# Patient Record
Sex: Female | Born: 1975 | Hispanic: No | Marital: Single | Smoking: Current every day smoker
Health system: Southern US, Community
[De-identification: ages and names within clinical notes are randomized; demographics above are authoritative.]

## PROBLEM LIST (undated history)

## (undated) DIAGNOSIS — D649 Anemia, unspecified: Secondary | ICD-10-CM

## (undated) DIAGNOSIS — B20 Human immunodeficiency virus [HIV] disease: Secondary | ICD-10-CM

## (undated) DIAGNOSIS — G629 Polyneuropathy, unspecified: Secondary | ICD-10-CM

## (undated) DIAGNOSIS — J45909 Unspecified asthma, uncomplicated: Secondary | ICD-10-CM

---

## 2014-04-01 ENCOUNTER — Encounter (HOSPITAL_COMMUNITY): Payer: Self-pay | Admitting: Emergency Medicine

## 2014-04-01 ENCOUNTER — Emergency Department (HOSPITAL_COMMUNITY): Payer: Self-pay

## 2014-04-01 DIAGNOSIS — J159 Unspecified bacterial pneumonia: Secondary | ICD-10-CM | POA: Insufficient documentation

## 2014-04-01 DIAGNOSIS — Z8669 Personal history of other diseases of the nervous system and sense organs: Secondary | ICD-10-CM | POA: Insufficient documentation

## 2014-04-01 DIAGNOSIS — Z862 Personal history of diseases of the blood and blood-forming organs and certain disorders involving the immune mechanism: Secondary | ICD-10-CM | POA: Insufficient documentation

## 2014-04-01 DIAGNOSIS — F17200 Nicotine dependence, unspecified, uncomplicated: Secondary | ICD-10-CM | POA: Insufficient documentation

## 2014-04-01 DIAGNOSIS — Z21 Asymptomatic human immunodeficiency virus [HIV] infection status: Secondary | ICD-10-CM | POA: Insufficient documentation

## 2014-04-01 DIAGNOSIS — J45901 Unspecified asthma with (acute) exacerbation: Secondary | ICD-10-CM | POA: Insufficient documentation

## 2014-04-01 NOTE — ED Notes (Addendum)
Pt c/o sob and left sided chest pain after eating dinner. Chest pain is when she takes a deep breath.

## 2014-04-02 ENCOUNTER — Emergency Department (HOSPITAL_COMMUNITY)
Admission: EM | Admit: 2014-04-02 | Discharge: 2014-04-02 | Disposition: A | Discharge status: Home / Self Care | Payer: Self-pay | Attending: Emergency Medicine | Admitting: Emergency Medicine

## 2014-04-02 DIAGNOSIS — J189 Pneumonia, unspecified organism: Secondary | ICD-10-CM

## 2014-04-02 HISTORY — DX: Human immunodeficiency virus (HIV) disease: B20

## 2014-04-02 HISTORY — DX: Polyneuropathy, unspecified: G62.9

## 2014-04-02 HISTORY — DX: Unspecified asthma, uncomplicated: J45.909

## 2014-04-02 HISTORY — DX: Anemia, unspecified: D64.9

## 2014-04-02 MED ORDER — SODIUM CHLORIDE 0.9 % IV BOLUS (SEPSIS)
1000.0000 mL | Freq: Once | INTRAVENOUS | Status: AC
  Administered 2014-04-02: 1000 mL via INTRAVENOUS

## 2014-04-02 MED ORDER — AZITHROMYCIN 250 MG PO TABS: 250.0000 mg | ORAL_TABLET | Freq: Every day | ORAL | Status: AC

## 2014-04-02 MED ORDER — NAPROXEN 500 MG PO TABS: 500.0000 mg | ORAL_TABLET | Freq: Two times a day (BID) | ORAL | Status: AC

## 2014-04-02 MED ORDER — AZITHROMYCIN 250 MG PO TABS
500.0000 mg | ORAL_TABLET | Freq: Once | ORAL | Status: AC
  Administered 2014-04-02: 500 mg via ORAL
  Filled 2014-04-02: qty 2

## 2014-04-02 MED ORDER — DEXTROSE 5 % IV SOLN
1.0000 g | Freq: Once | INTRAVENOUS | Status: AC
  Administered 2014-04-02: 1 g via INTRAVENOUS
  Filled 2014-04-02: qty 10

## 2014-04-02 MED ORDER — KETOROLAC TROMETHAMINE 30 MG/ML IJ SOLN
30.0000 mg | Freq: Once | INTRAMUSCULAR | Status: AC
  Administered 2014-04-02: 30 mg via INTRAVENOUS
  Filled 2014-04-02: qty 1

## 2014-04-02 MED ORDER — BENZONATATE 100 MG PO CAPS: 100.0000 mg | ORAL_CAPSULE | Freq: Three times a day (TID) | ORAL | Status: AC

## 2014-04-02 NOTE — Discharge Instructions (Signed)
Please call your doctor for a followup appointment within 24-48 hours. When you talk to your doctor please let them know that you were seen in the emergency department and have them acquire all of your records so that they can discuss the findings with you and formulate a treatment plan to fully care for your new and ongoing problems. ° °[facility] ° ° ° °Edward Hawkins MD. Specialty: Pulmonary Disease Contact information: [address]  °[address]  °[place] [address]  °[phone]  ° °Margaret Simpson, MD. Specialty: Family Medicine Contact information: [address]  °[place] [address]  °[phone]  ° °Scott Luking, MD. Specialty: Family Medicine Contact information: [address]  °[address]  °[place] [address]  °[phone]  ° °Tesfaye Fanta, MD Specialty: Internal Medicine Contact information: [address]  °[place] [address]  °[phone]  ° °Zach Hall, MD. Specialty: Internal Medicine Contact information: [address]  °[place] [address]  °[phone]  ° °Angus Mcinnis, MD. Specialty: Family Medicine Contact information: [address]  °[place] [address]  °[phone]  ° °Stephen Knowlton, MD. Specialty: Family Medicine Contact information: [address]  °[address]  °[place] [address]  °[phone]  ° °Roy Fagan, MD. Specialty: Internal Medicine Contact information: [address]  °[address]  °[place]  [address]  °[phone]  ° ° °

## 2014-04-02 NOTE — ED Provider Notes (Signed)
CSN: [id number]     Arrival date & time 04/01/14  2220 History   First MD Initiated Contact with Patient 04/02/14 0118     Chief Complaint  Patient presents with  . Shortness of Breath     (Consider location/radiation/quality/duration/timing/severity/associated sxs/prior Treatment) HPI Comments: 38 year old female with a history of HIV, asthma who has been complaining of shortness of breath and chest pain for 2 days. She has an associated productive cough with phlegm. Currently her pain is over her left side, sharp and stabbing, worse with deep breathing and not associated with fevers chills or vomiting. She does note having some watery diarrhea for the last couple of days.  Patient is a 38 y.o. female presenting with shortness of breath. The history is provided by the patient.  Shortness of Breath   Past Medical History  Diagnosis Date  . HIV (human immunodeficiency virus infection)   . Asthma   . Anemia   . Neuropathy    History reviewed. No pertinent past surgical history. History reviewed. No pertinent family history. History  Substance Use Topics  . Smoking status: Current Every Day Smoker  . Smokeless tobacco: Not on file  . Alcohol Use: No   OB History   Grav Para Term Preterm Abortions TAB SAB Ect Mult Living                 Review of Systems  Respiratory: Positive for shortness of breath.   All other systems reviewed and are negative.     Allergies  Ivp dye  Home Medications   Prior to Admission medications   Not on File   BP 105/73  Pulse 93  Temp(Src) 99 F (37.2 C) (Oral)  Resp 16  Ht 5' (1.524 m)  Wt 93 lb (42.185 kg)  BMI 18.16 kg/m2  SpO2 98%  LMP 03/24/2014 Physical Exam  Nursing note and vitals reviewed. Constitutional: She appears well-developed and well-nourished. No distress.  HENT:  Head: Normocephalic and atraumatic.  Mouth/Throat: Oropharynx is clear and moist. No oropharyngeal exudate.  Eyes: Conjunctivae and EOM are normal.  Pupils are equal, round, and reactive to light. Right eye exhibits no discharge. Left eye exhibits no discharge. No scleral icterus.  Neck: Normal range of motion. Neck supple. No JVD present. No thyromegaly present.  Cardiovascular: Normal rate, regular rhythm, normal heart sounds and intact distal pulses.  Exam reveals no gallop and no friction rub.   No murmur heard. Pulmonary/Chest: Effort normal and breath sounds normal. No respiratory distress. She has no wheezes. She has no rales.  Abdominal: Soft. Bowel sounds are normal. She exhibits no distension and no mass. There is no tenderness.  Musculoskeletal: Normal range of motion. She exhibits no edema and no tenderness.  Lymphadenopathy:    She has no cervical adenopathy.  Neurological: She is alert. Coordination normal.  Skin: Skin is warm and dry. No rash noted. No erythema.  Psychiatric: She has a normal mood and affect. Her behavior is normal.    ED Course  Procedures (including critical care time) Labs Review Labs Reviewed - No data to display  Imaging Review Dg Chest 2 View  04/01/2014   CLINICAL DATA:  Chest pain, cough and fever  EXAM: CHEST  2 VIEW  COMPARISON:  None.  FINDINGS: Heart size is normal. Mediastinal shadows are normal. There is lingular pneumonia. No lobar collapse. No effusions. No bony finding. The lungs in general are slightly hyperinflated.  IMPRESSION: Lingular pneumonia.   Electronically Signed   By:  Paulina FusiMark  Shogry M.D.   On: 04/01/2014 23:10      MDM   Final diagnoses:  None    The patient has concerning symptoms for pneumonia or possible pneumothorax but on exam has no focal rales or respiratory distress. Oxygenation is 97% on room air, no fever, normal pulse and mild hypotension. The patient does have HIV, she is very very small and I suspect that mild hypotension is probably her baseline. At this time chest x-ray was obtained and a 2 view PA and lateral view of the chest shows a lingular pneumonia  consistent with the patient's left-sided sharp chest pain. She will be given antibiotics, IV fluids but does not appear toxic and will likely be discharged home.  Medications given as below, patient appears stable for discharge  Meds given in ED:  Medications  sodium chloride 0.9 % bolus 1,000 mL (1,000 mLs Intravenous New Bag/Given 04/02/14 0155)  cefTRIAXone (ROCEPHIN) 1 g in dextrose 5 % 50 mL IVPB (1 g Intravenous New Bag/Given 04/02/14 0156)  azithromycin (ZITHROMAX) tablet 500 mg (500 mg Oral Given 04/02/14 0153)  ketorolac (TORADOL) 30 MG/ML injection 30 mg (30 mg Intravenous Given 04/02/14 0154)    New Prescriptions   AZITHROMYCIN (ZITHROMAX Z-PAK) 250 MG TABLET    Take 1 tablet (250 mg total) by mouth daily. 500mg  PO day 1, then 250mg  PO days 205   BENZONATATE (TESSALON) 100 MG CAPSULE    Take 1 capsule (100 mg total) by mouth every 8 (eight) hours.   NAPROXEN (NAPROSYN) 500 MG TABLET    Take 1 tablet (500 mg total) by mouth 2 (two) times daily with a meal.      Vida RollerBrian D Mildred Tuccillo, MD 04/02/14 [phone]

## 2014-04-02 NOTE — ED Notes (Signed)
Pt alert & oriented x4, stable gait. Patient given discharge instructions, paperwork & prescription(s). Patient  instructed to stop at the registration desk to finish any additional paperwork. Patient verbalized understanding. Pt left department w/ no further questions. 

## 2014-05-25 ENCOUNTER — Telehealth: Payer: Self-pay

## 2014-05-25 NOTE — Telephone Encounter (Signed)
Message left on voicemail to call the office to schedule intake appointment.   Referral from [facility].   Laurell Josephs, RN

## 2014-06-16 ENCOUNTER — Ambulatory Visit: Payer: Self-pay

## 2015-03-17 IMAGING — CR DG CHEST 2V
2 series · 2 of 2 positions shown · non-contrast
Comparison: None.

CLINICAL DATA: Chest pain, cough and fever

EXAM:
CHEST  2 VIEW

[view not recorded (1 of 2)]
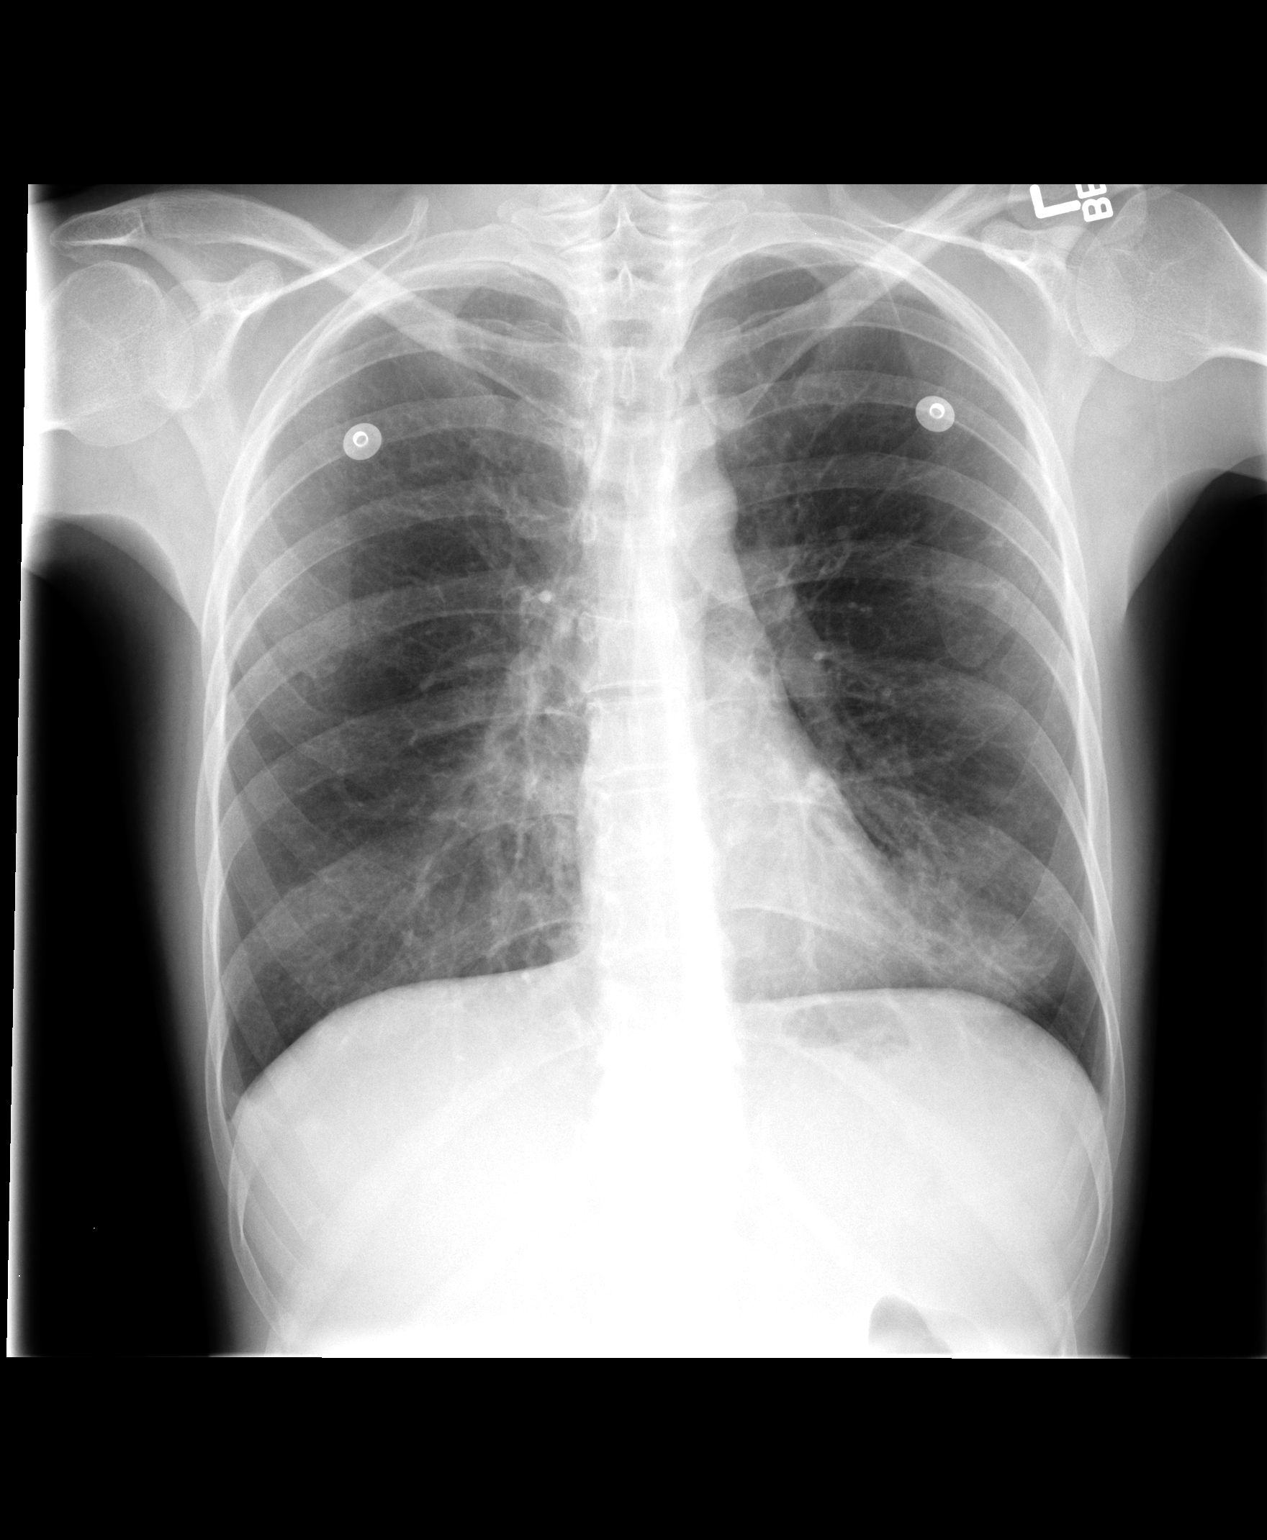

[view not recorded (2 of 2)]
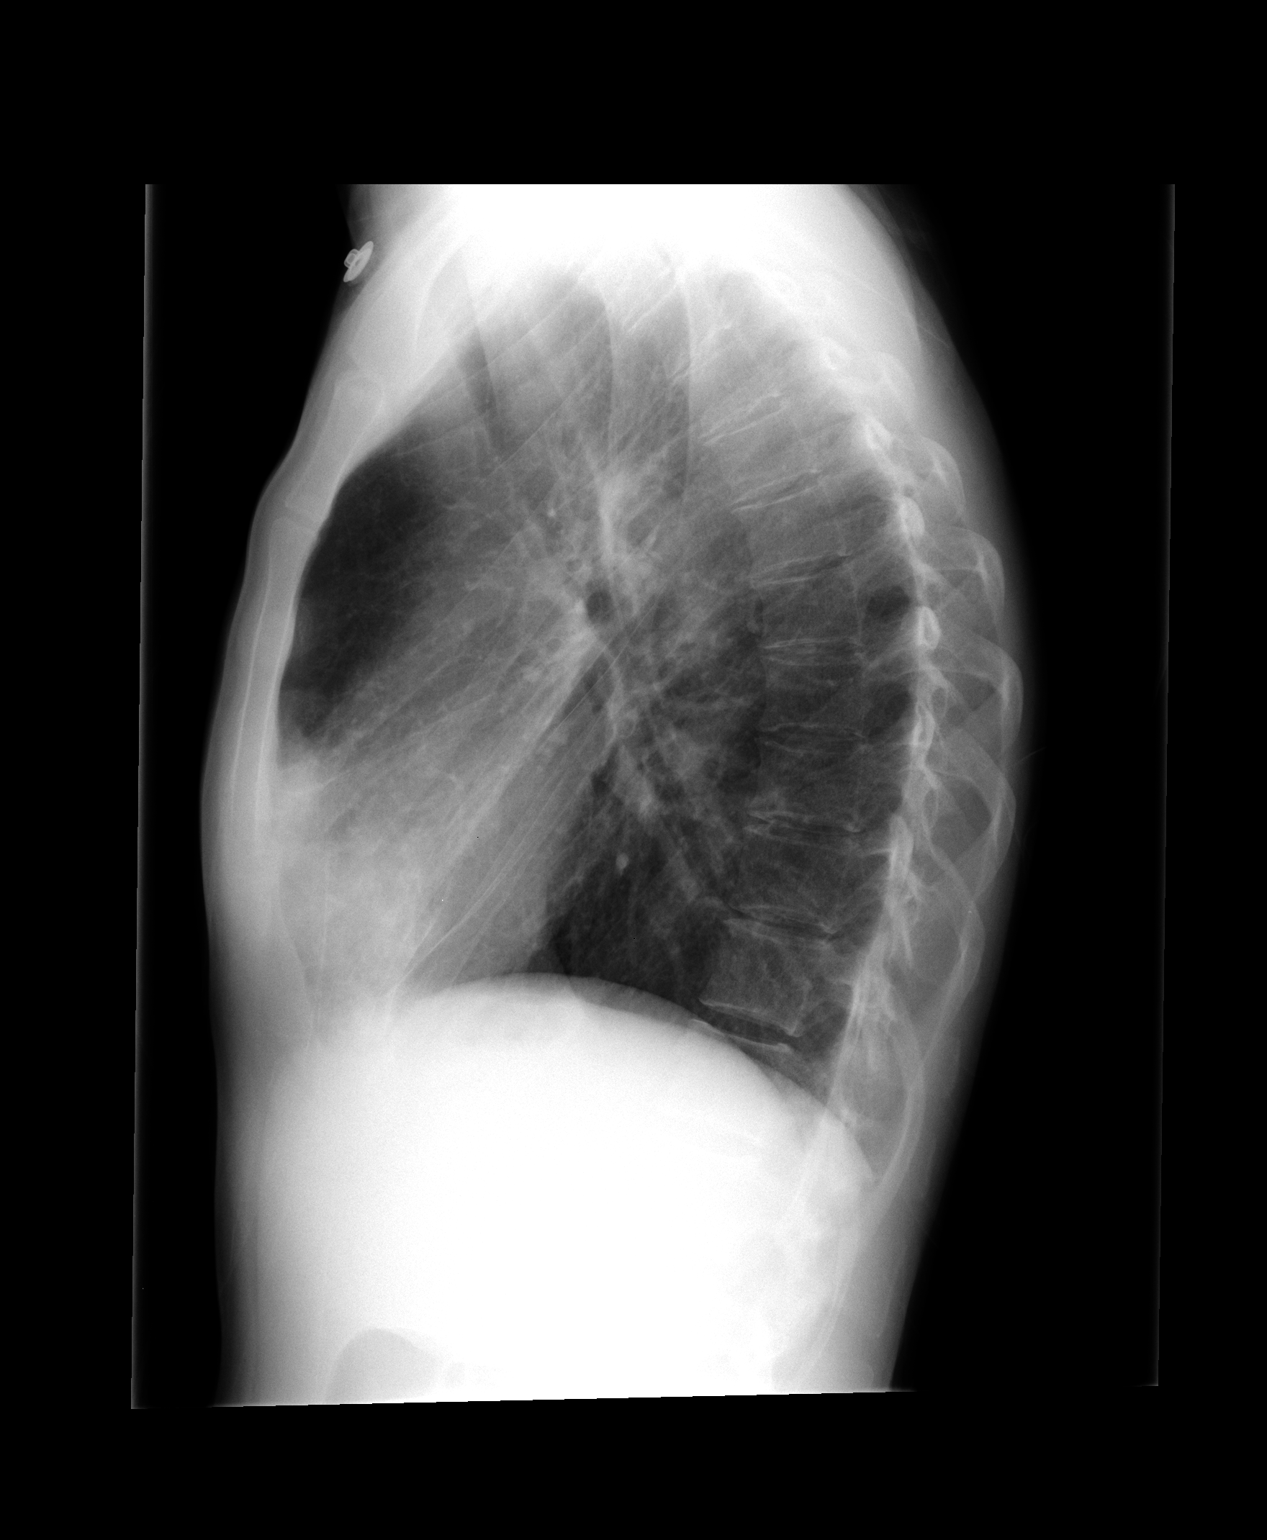

[2 of 2 positions shown; findings below may reference images not displayed]

FINDINGS: Heart size is normal. Mediastinal shadows are normal. There is
lingular pneumonia. No lobar collapse. No effusions. No bony
finding. The lungs in general are slightly hyperinflated.
IMPRESSION: Lingular pneumonia.
